# Patient Record
Sex: Male | Born: 1940 | Race: White | Hispanic: No | Smoking: Former smoker
Health system: Southern US, Community
[De-identification: ages and names within clinical notes are randomized; demographics above are authoritative.]

## PROBLEM LIST (undated history)

## (undated) DIAGNOSIS — R51 Headache: Secondary | ICD-10-CM

## (undated) DIAGNOSIS — J309 Allergic rhinitis, unspecified: Secondary | ICD-10-CM

## (undated) DIAGNOSIS — E785 Hyperlipidemia, unspecified: Secondary | ICD-10-CM

## (undated) DIAGNOSIS — J45909 Unspecified asthma, uncomplicated: Secondary | ICD-10-CM

## (undated) DIAGNOSIS — K5792 Diverticulitis of intestine, part unspecified, without perforation or abscess without bleeding: Secondary | ICD-10-CM

## (undated) DIAGNOSIS — N529 Male erectile dysfunction, unspecified: Secondary | ICD-10-CM

## (undated) DIAGNOSIS — R519 Headache, unspecified: Secondary | ICD-10-CM

## (undated) DIAGNOSIS — D126 Benign neoplasm of colon, unspecified: Secondary | ICD-10-CM

## (undated) HISTORY — PX: LUNG SEGMENTECTOMY: SHX452

## (undated) HISTORY — DX: Hyperlipidemia, unspecified: E78.5

## (undated) HISTORY — PX: COLONOSCOPY W/ POLYPECTOMY: SHX1380

## (undated) HISTORY — DX: Allergic rhinitis, unspecified: J30.9

## (undated) HISTORY — DX: Male erectile dysfunction, unspecified: N52.9

## (undated) HISTORY — DX: Benign neoplasm of colon, unspecified: D12.6

## (undated) HISTORY — DX: Diverticulitis of intestine, part unspecified, without perforation or abscess without bleeding: K57.92

## (undated) HISTORY — DX: Unspecified asthma, uncomplicated: J45.909

---

## 2001-01-19 ENCOUNTER — Emergency Department (HOSPITAL_COMMUNITY): Admission: EM | Admit: 2001-01-19 | Discharge: 2001-01-20 | Payer: Self-pay | Admitting: *Deleted

## 2004-10-21 ENCOUNTER — Ambulatory Visit (HOSPITAL_COMMUNITY): Admission: RE | Admit: 2004-10-21 | Discharge: 2004-10-21 | Payer: Self-pay | Admitting: Gastroenterology

## 2004-10-21 ENCOUNTER — Encounter (INDEPENDENT_AMBULATORY_CARE_PROVIDER_SITE_OTHER): Payer: Self-pay | Admitting: *Deleted

## 2009-10-31 ENCOUNTER — Ambulatory Visit (HOSPITAL_COMMUNITY): Admission: RE | Admit: 2009-10-31 | Discharge: 2009-10-31 | Payer: Self-pay | Admitting: Gastroenterology

## 2011-01-30 NOTE — Op Note (Signed)
NAME:  Jerry Davidson, Jerry Davidson NO.:  [id number]   MEDICAL RECORD NO.:  [id number]          PATIENT TYPE:  AMB   LOCATION:  ENDO                         FACILITY:  [facility]   PHYSICIAN:  Danise Edge, M.D.   DATE OF BIRTH:  August 28, 1941   DATE OF PROCEDURE:  10/21/2004  DATE OF DISCHARGE:                                 OPERATIVE REPORT   PROCEDURE:  Colonoscopy with polypectomy.   INDICATIONS FOR PROCEDURE:  [name] is a 70 year old male born  1941-02-21.  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  He is  scheduled to undergo his first screening colonoscopy with polypectomy to  prevent colon cancer.   ENDOSCOPIST:  Danise Edge, M.D.   PREMEDICATION:  Versed 6 mg, Demerol 70 mg.   DESCRIPTION OF PROCEDURE:  After obtaining informed consent, [name] was  placed in the left lateral decubitus position.  I administered intravenous  Demerol and intravenous Versed to achieve conscious sedation for the  procedure.  The patient's blood pressure, oxygen saturation, and cardiac  rhythm were monitored throughout the procedure and documented in the medical  record.   Anal inspection and digital rectal exam were normal.  The prostate was  nonnodular.  The Olympus adjustable pediatric colonoscope was introduced  into the rectum and advanced to the cecum.  The colonic preparation for the  exam today was excellent.   Rectum:  From the distal rectum, a 1-mm sessile polyp was removed with the  cold biopsy forceps.   Sigmoid colon and descending colon:  Colonic diverticulosis.   Splenic flexure:  Normal.   Transverse colon:  Normal.   Hepatic flexure:  Normal.   Ascending colon:  Normal.   Cecum and ileocecal valve:  Normal.   ASSESSMENT:  1.  From the distal rectum, a small polyp was removed.  2.  Left colonic diverticulosis.  3.  Otherwise normal screening proctocolonoscopy to the cecum.      MJ/MEDQ  D:  10/21/2004  T:  10/21/2004  Job:   [id number]   cc:   Theressa Millard, M.D.  [address] [place]  [place] [address]  Fax: [phone]

## 2012-09-14 DIAGNOSIS — K5792 Diverticulitis of intestine, part unspecified, without perforation or abscess without bleeding: Secondary | ICD-10-CM

## 2012-09-14 HISTORY — DX: Diverticulitis of intestine, part unspecified, without perforation or abscess without bleeding: K57.92

## 2013-02-17 ENCOUNTER — Other Ambulatory Visit: Payer: Self-pay | Admitting: Internal Medicine

## 2013-02-17 ENCOUNTER — Ambulatory Visit
Admission: RE | Admit: 2013-02-17 | Discharge: 2013-02-17 | Disposition: A | Discharge status: Home / Self Care | Payer: 59 | Source: Ambulatory Visit | Attending: Internal Medicine | Admitting: Internal Medicine

## 2013-02-17 DIAGNOSIS — R109 Unspecified abdominal pain: Secondary | ICD-10-CM

## 2013-02-17 MED ORDER — IOHEXOL 300 MG/ML  SOLN
100.0000 mL | Freq: Once | INTRAMUSCULAR | Status: AC | PRN
  Administered 2013-02-17: 100 mL via INTRAVENOUS

## 2014-03-05 ENCOUNTER — Other Ambulatory Visit: Payer: Self-pay | Admitting: Internal Medicine

## 2014-03-05 DIAGNOSIS — R1010 Upper abdominal pain, unspecified: Secondary | ICD-10-CM

## 2014-03-09 ENCOUNTER — Ambulatory Visit
Admission: RE | Admit: 2014-03-09 | Discharge: 2014-03-09 | Disposition: A | Discharge status: Home / Self Care | Payer: Medicare Other | Source: Ambulatory Visit | Attending: Internal Medicine | Admitting: Internal Medicine

## 2014-03-09 DIAGNOSIS — R1010 Upper abdominal pain, unspecified: Secondary | ICD-10-CM

## 2014-11-28 ENCOUNTER — Other Ambulatory Visit: Payer: Self-pay | Admitting: Gastroenterology

## 2015-01-17 ENCOUNTER — Encounter (HOSPITAL_COMMUNITY): Payer: Self-pay | Admitting: *Deleted

## 2015-01-25 ENCOUNTER — Other Ambulatory Visit: Payer: Self-pay | Admitting: Gastroenterology

## 2015-01-28 ENCOUNTER — Ambulatory Visit (HOSPITAL_COMMUNITY): Payer: Medicare Other | Admitting: Certified Registered"

## 2015-01-28 ENCOUNTER — Encounter (HOSPITAL_COMMUNITY): Payer: Self-pay

## 2015-01-28 ENCOUNTER — Encounter (HOSPITAL_COMMUNITY)
Admission: RE | Disposition: A | Discharge status: Home / Self Care | Payer: Self-pay | Source: Ambulatory Visit | Attending: Gastroenterology

## 2015-01-28 ENCOUNTER — Ambulatory Visit (HOSPITAL_COMMUNITY)
Admission: RE | Admit: 2015-01-28 | Discharge: 2015-01-28 | Disposition: A | Discharge status: Home / Self Care | Payer: Medicare Other | Source: Ambulatory Visit | Attending: Gastroenterology | Admitting: Gastroenterology

## 2015-01-28 DIAGNOSIS — Z87891 Personal history of nicotine dependence: Secondary | ICD-10-CM | POA: Diagnosis not present

## 2015-01-28 DIAGNOSIS — D122 Benign neoplasm of ascending colon: Secondary | ICD-10-CM | POA: Diagnosis not present

## 2015-01-28 DIAGNOSIS — Z86010 Personal history of colonic polyps: Secondary | ICD-10-CM | POA: Insufficient documentation

## 2015-01-28 DIAGNOSIS — K5730 Diverticulosis of large intestine without perforation or abscess without bleeding: Secondary | ICD-10-CM | POA: Insufficient documentation

## 2015-01-28 DIAGNOSIS — J4530 Mild persistent asthma, uncomplicated: Secondary | ICD-10-CM | POA: Insufficient documentation

## 2015-01-28 DIAGNOSIS — Z09 Encounter for follow-up examination after completed treatment for conditions other than malignant neoplasm: Secondary | ICD-10-CM | POA: Diagnosis present

## 2015-01-28 DIAGNOSIS — K589 Irritable bowel syndrome without diarrhea: Secondary | ICD-10-CM | POA: Insufficient documentation

## 2015-01-28 HISTORY — DX: Headache: R51

## 2015-01-28 HISTORY — DX: Headache, unspecified: R51.9

## 2015-01-28 HISTORY — PX: COLONOSCOPY WITH PROPOFOL: SHX5780

## 2015-01-28 SURGERY — COLONOSCOPY WITH PROPOFOL
Anesthesia: Monitor Anesthesia Care

## 2015-01-28 MED ORDER — SODIUM CHLORIDE 0.9 % IV SOLN: INTRAVENOUS | Status: DC

## 2015-01-28 MED ORDER — PROPOFOL 10 MG/ML IV BOLUS
INTRAVENOUS | Status: DC | PRN
  Administered 2015-01-28 (×5): 50 mg via INTRAVENOUS
  Administered 2015-01-28: 100 mg via INTRAVENOUS

## 2015-01-28 MED ORDER — PROPOFOL 10 MG/ML IV BOLUS
INTRAVENOUS | Status: AC
  Filled 2015-01-28: qty 20

## 2015-01-28 MED ORDER — LACTATED RINGERS IV SOLN
INTRAVENOUS | Status: DC
  Administered 2015-01-28: 1000 mL via INTRAVENOUS

## 2015-01-28 SURGICAL SUPPLY — 22 items

## 2015-01-28 NOTE — Discharge Instructions (Signed)

## 2015-01-28 NOTE — Transfer of Care (Signed)
Immediate Anesthesia Transfer of Care Note  Patient: Jerry Davidson  Procedure(s) Performed: Procedure(s): COLONOSCOPY WITH PROPOFOL (N/A)  Patient Location: PACU and Endoscopy Unit  Anesthesia Type:MAC  Level of Consciousness: awake, sedated and responds to stimulation  Airway & Oxygen Therapy: Patient Spontanous Breathing and Patient connected to nasal cannula oxygen  Post-op Assessment: Report given to RN and Post -op Vital signs reviewed and stable  Post vital signs: Reviewed and stable  Last Vitals:  Filed Vitals:   01/28/15 0946  BP: 90/44  Pulse: 56  Temp: 37 C  Resp: 18    Complications: No apparent anesthesia complications

## 2015-01-28 NOTE — H&P (Signed)
  Procedure: Surveillance colonoscopy. 10/31/2009 normal surveillance colonoscopy performed. 2006 colonoscopy with removal of a diminutive adenomatous polyp  History: The patient is a 74 year old male born 07-28-1941. He is scheduled to undergo a surveillance colonoscopy today.  Past medical history: Hypercholesterolemia. Mild persistent asthma. Irritable bowel syndrome. Allergic rhinitis. 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 Abdomen is soft and nontender to palpation. Lungs are clear to auscultation. Cardiac exam reveals a regular rhythm.  Plan: Proceed with surveillance colonoscopy

## 2015-01-28 NOTE — Anesthesia Preprocedure Evaluation (Signed)
Anesthesia Evaluation  Patient identified by MRN, date of birth, ID band Patient awake    Reviewed: Allergy & Precautions, NPO status , Patient's Chart, lab work & pertinent test results  Airway Mallampati: I   Neck ROM: Full    Dental   Pulmonary former smoker,    Pulmonary exam normal       Cardiovascular Normal cardiovascular exam    Neuro/Psych    GI/Hepatic   Endo/Other    Renal/GU      Musculoskeletal   Abdominal   Peds  Hematology   Anesthesia Other Findings   Reproductive/Obstetrics                             Anesthesia Physical Anesthesia Plan  ASA: II  Anesthesia Plan: MAC   Post-op Pain Management:    Induction: Intravenous  Airway Management Planned: Natural Airway  Additional Equipment:   Intra-op Plan:   Post-operative Plan:   Informed Consent: I have reviewed the patients History and Physical, chart, labs and discussed the procedure including the risks, benefits and alternatives for the proposed anesthesia with the patient or authorized representative who has indicated his/her understanding and acceptance.     Plan Discussed with: CRNA and Surgeon  Anesthesia Plan Comments:         Anesthesia Quick Evaluation

## 2015-01-28 NOTE — Anesthesia Postprocedure Evaluation (Signed)
Anesthesia Post Note  Patient: Jerry Davidson  Procedure(s) Performed: Procedure(s) (LRB): COLONOSCOPY WITH PROPOFOL (N/A)  Anesthesia type: MAC  Patient location: PACU  Post pain: Pain level controlled  Post assessment: Patient's Cardiovascular Status Stable  Last Vitals:  Filed Vitals:   01/28/15 1020  BP: 113/55  Pulse: 52  Temp:   Resp: 19    Post vital signs: Reviewed and stable  Level of consciousness: awake  Complications: No apparent anesthesia complications

## 2015-01-28 NOTE — Op Note (Signed)
Procedure: Surveillance colonoscopy. 2006 colonoscopy performed with removal of a diminutive adenomatous rectal polyp  Endoscopist: Earle Gell  Premedication: Propofol administered by anesthesia  Procedure: The patient was placed in the left lateral decubitus position. Anal inspection and digital rectal exam were normal. The Pentax pediatric colonoscope was introduced into the rectum and advanced to the cecum. A normal-appearing appendiceal orifice was identified. A normal-appearing ileocecal valve was intubated and the terminal ileum inspected. Colonic preparation for the exam today was good. Withdrawal time was 20 minutes  Rectum. Normal. Retroflexed view of the distal rectum was normal  Sigmoid colon and descending colon. Left colonic diverticulosis  Splenic flexure. Normal  Transverse colon. Normal  Hepatic flexure. Normal  Ascending colon. Two 5 mm sessile polyps were removed with the cold snare and a 7 mm sessile polyp was removed in piecemeal fashion with the hot snare  Cecum and ileocecal valve. Normal  Terminal ileum. Normal  Assessment:  #1. Left colonic diverticulosis  #2. Three small sessile polyps were removed from the ascending colon  Plan: I will reevaluate the polyp pathology to determine when the patient should undergo a repeat colonoscopy

## 2015-01-29 ENCOUNTER — Encounter (HOSPITAL_COMMUNITY): Payer: Self-pay | Admitting: Gastroenterology

## 2015-10-17 DIAGNOSIS — H2511 Age-related nuclear cataract, right eye: Secondary | ICD-10-CM | POA: Diagnosis not present

## 2015-10-17 DIAGNOSIS — H35363 Drusen (degenerative) of macula, bilateral: Secondary | ICD-10-CM | POA: Diagnosis not present

## 2015-10-17 DIAGNOSIS — H25012 Cortical age-related cataract, left eye: Secondary | ICD-10-CM | POA: Diagnosis not present

## 2015-10-17 DIAGNOSIS — H2512 Age-related nuclear cataract, left eye: Secondary | ICD-10-CM | POA: Diagnosis not present

## 2015-10-17 DIAGNOSIS — H25011 Cortical age-related cataract, right eye: Secondary | ICD-10-CM | POA: Diagnosis not present

## 2015-10-29 DIAGNOSIS — Z125 Encounter for screening for malignant neoplasm of prostate: Secondary | ICD-10-CM | POA: Diagnosis not present

## 2015-10-29 DIAGNOSIS — Z79899 Other long term (current) drug therapy: Secondary | ICD-10-CM | POA: Diagnosis not present

## 2015-10-29 DIAGNOSIS — Z0000 Encounter for general adult medical examination without abnormal findings: Secondary | ICD-10-CM | POA: Diagnosis not present

## 2015-10-29 DIAGNOSIS — E785 Hyperlipidemia, unspecified: Secondary | ICD-10-CM | POA: Diagnosis not present

## 2015-10-31 DIAGNOSIS — M7542 Impingement syndrome of left shoulder: Secondary | ICD-10-CM | POA: Diagnosis not present

## 2015-11-05 DIAGNOSIS — M25612 Stiffness of left shoulder, not elsewhere classified: Secondary | ICD-10-CM | POA: Diagnosis not present

## 2015-11-05 DIAGNOSIS — M6281 Muscle weakness (generalized): Secondary | ICD-10-CM | POA: Diagnosis not present

## 2015-11-05 DIAGNOSIS — M25512 Pain in left shoulder: Secondary | ICD-10-CM | POA: Diagnosis not present

## 2015-11-05 DIAGNOSIS — M7542 Impingement syndrome of left shoulder: Secondary | ICD-10-CM | POA: Diagnosis not present

## 2015-11-14 DIAGNOSIS — M6281 Muscle weakness (generalized): Secondary | ICD-10-CM | POA: Diagnosis not present

## 2015-11-14 DIAGNOSIS — M25612 Stiffness of left shoulder, not elsewhere classified: Secondary | ICD-10-CM | POA: Diagnosis not present

## 2015-11-14 DIAGNOSIS — M25512 Pain in left shoulder: Secondary | ICD-10-CM | POA: Diagnosis not present

## 2015-11-14 DIAGNOSIS — M7542 Impingement syndrome of left shoulder: Secondary | ICD-10-CM | POA: Diagnosis not present

## 2015-11-20 ENCOUNTER — Ambulatory Visit
Admission: RE | Admit: 2015-11-20 | Discharge: 2015-11-20 | Disposition: A | Discharge status: Home / Self Care | Payer: Medicare Other | Source: Ambulatory Visit | Attending: Internal Medicine | Admitting: Internal Medicine

## 2015-11-20 ENCOUNTER — Other Ambulatory Visit: Payer: Self-pay | Admitting: Internal Medicine

## 2015-11-20 DIAGNOSIS — N5089 Other specified disorders of the male genital organs: Secondary | ICD-10-CM | POA: Diagnosis not present

## 2015-11-20 DIAGNOSIS — M6281 Muscle weakness (generalized): Secondary | ICD-10-CM | POA: Diagnosis not present

## 2015-11-20 DIAGNOSIS — M25512 Pain in left shoulder: Secondary | ICD-10-CM | POA: Diagnosis not present

## 2015-11-20 DIAGNOSIS — M25612 Stiffness of left shoulder, not elsewhere classified: Secondary | ICD-10-CM | POA: Diagnosis not present

## 2015-11-20 DIAGNOSIS — M7542 Impingement syndrome of left shoulder: Secondary | ICD-10-CM | POA: Diagnosis not present

## 2015-11-29 DIAGNOSIS — Z0000 Encounter for general adult medical examination without abnormal findings: Secondary | ICD-10-CM | POA: Diagnosis not present

## 2015-11-29 DIAGNOSIS — N4340 Spermatocele of epididymis, unspecified: Secondary | ICD-10-CM | POA: Diagnosis not present

## 2015-12-02 DIAGNOSIS — M7542 Impingement syndrome of left shoulder: Secondary | ICD-10-CM | POA: Diagnosis not present

## 2015-12-04 DIAGNOSIS — M25512 Pain in left shoulder: Secondary | ICD-10-CM | POA: Diagnosis not present

## 2015-12-04 DIAGNOSIS — M25612 Stiffness of left shoulder, not elsewhere classified: Secondary | ICD-10-CM | POA: Diagnosis not present

## 2015-12-04 DIAGNOSIS — M7542 Impingement syndrome of left shoulder: Secondary | ICD-10-CM | POA: Diagnosis not present

## 2015-12-04 DIAGNOSIS — M6281 Muscle weakness (generalized): Secondary | ICD-10-CM | POA: Diagnosis not present

## 2015-12-12 DIAGNOSIS — M6281 Muscle weakness (generalized): Secondary | ICD-10-CM | POA: Diagnosis not present

## 2015-12-12 DIAGNOSIS — M25612 Stiffness of left shoulder, not elsewhere classified: Secondary | ICD-10-CM | POA: Diagnosis not present

## 2015-12-12 DIAGNOSIS — M25512 Pain in left shoulder: Secondary | ICD-10-CM | POA: Diagnosis not present

## 2015-12-12 DIAGNOSIS — M7542 Impingement syndrome of left shoulder: Secondary | ICD-10-CM | POA: Diagnosis not present

## 2016-10-20 DIAGNOSIS — H2513 Age-related nuclear cataract, bilateral: Secondary | ICD-10-CM | POA: Diagnosis not present

## 2016-10-20 DIAGNOSIS — H43813 Vitreous degeneration, bilateral: Secondary | ICD-10-CM | POA: Diagnosis not present

## 2016-10-20 DIAGNOSIS — H25013 Cortical age-related cataract, bilateral: Secondary | ICD-10-CM | POA: Diagnosis not present

## 2016-10-20 DIAGNOSIS — H35363 Drusen (degenerative) of macula, bilateral: Secondary | ICD-10-CM | POA: Diagnosis not present

## 2016-10-29 DIAGNOSIS — K219 Gastro-esophageal reflux disease without esophagitis: Secondary | ICD-10-CM | POA: Diagnosis not present

## 2016-10-29 DIAGNOSIS — Z1389 Encounter for screening for other disorder: Secondary | ICD-10-CM | POA: Diagnosis not present

## 2016-10-29 DIAGNOSIS — E785 Hyperlipidemia, unspecified: Secondary | ICD-10-CM | POA: Diagnosis not present

## 2016-10-29 DIAGNOSIS — K589 Irritable bowel syndrome without diarrhea: Secondary | ICD-10-CM | POA: Diagnosis not present

## 2016-10-29 DIAGNOSIS — J45909 Unspecified asthma, uncomplicated: Secondary | ICD-10-CM | POA: Diagnosis not present

## 2016-10-29 DIAGNOSIS — Z125 Encounter for screening for malignant neoplasm of prostate: Secondary | ICD-10-CM | POA: Diagnosis not present

## 2016-10-29 DIAGNOSIS — Z0000 Encounter for general adult medical examination without abnormal findings: Secondary | ICD-10-CM | POA: Diagnosis not present

## 2016-10-29 DIAGNOSIS — R109 Unspecified abdominal pain: Secondary | ICD-10-CM | POA: Diagnosis not present

## 2016-11-05 DIAGNOSIS — N183 Chronic kidney disease, stage 3 (moderate): Secondary | ICD-10-CM | POA: Diagnosis not present

## 2016-11-05 DIAGNOSIS — K5792 Diverticulitis of intestine, part unspecified, without perforation or abscess without bleeding: Secondary | ICD-10-CM | POA: Diagnosis not present

## 2016-11-05 DIAGNOSIS — E785 Hyperlipidemia, unspecified: Secondary | ICD-10-CM | POA: Diagnosis not present

## 2017-05-05 DIAGNOSIS — E785 Hyperlipidemia, unspecified: Secondary | ICD-10-CM | POA: Diagnosis not present

## 2017-05-05 DIAGNOSIS — K219 Gastro-esophageal reflux disease without esophagitis: Secondary | ICD-10-CM | POA: Diagnosis not present

## 2017-05-05 DIAGNOSIS — R1032 Left lower quadrant pain: Secondary | ICD-10-CM | POA: Diagnosis not present

## 2017-05-05 DIAGNOSIS — R1031 Right lower quadrant pain: Secondary | ICD-10-CM | POA: Diagnosis not present

## 2017-07-21 IMAGING — US US SCROTUM
1 series · 14 of 25 positions shown · non-contrast
Comparison: CT 02/17/2013.

CLINICAL DATA: Right scrotal swelling.

EXAM:
SCROTAL ULTRASOUND
DOPPLER ULTRASOUND OF THE TESTICLES
TECHNIQUE: Complete ultrasound examination of the testicles, epididymis, and
other scrotal structures was performed. Color and spectral Doppler
ultrasound were also utilized to evaluate blood flow to the
testicles.

[Series 1: us scrotum · 38 acquisitions, 14 frames shown]
[im 1/38]
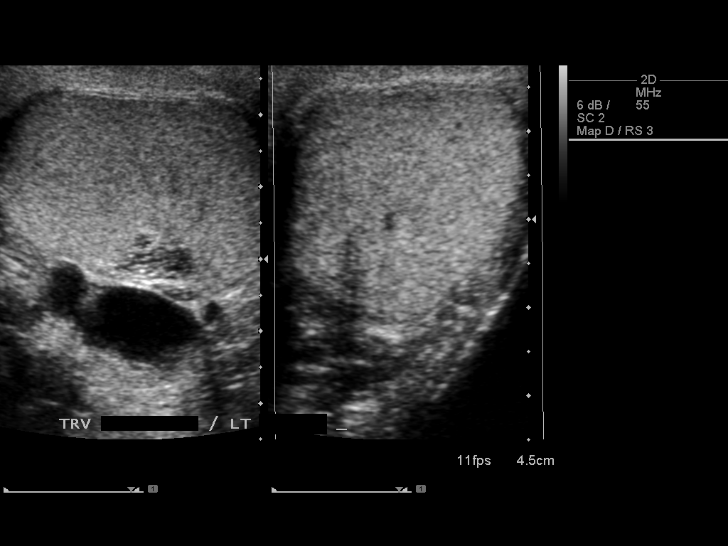
[im 4/38]
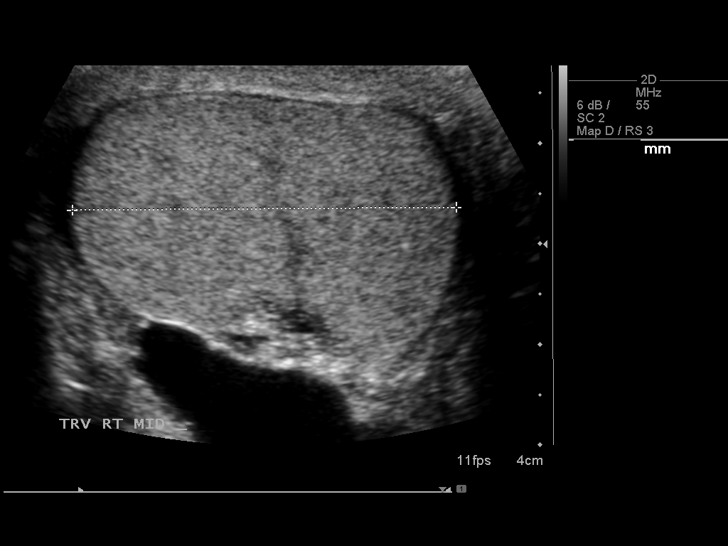
[im 7/38]
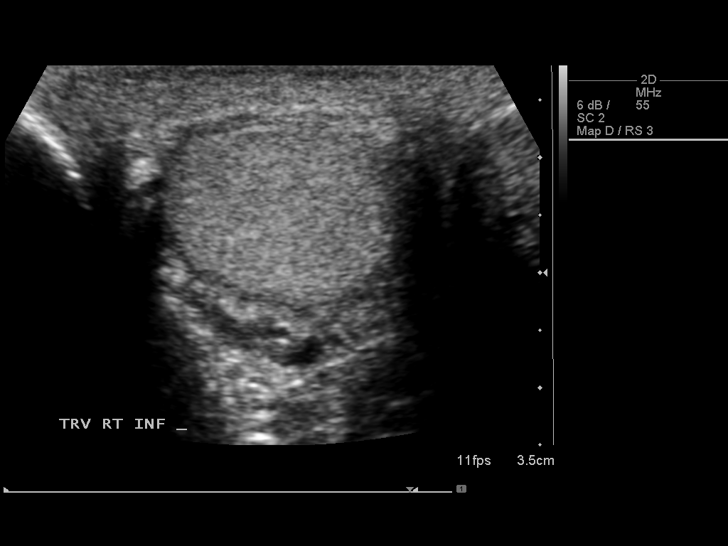
[im 10/38]
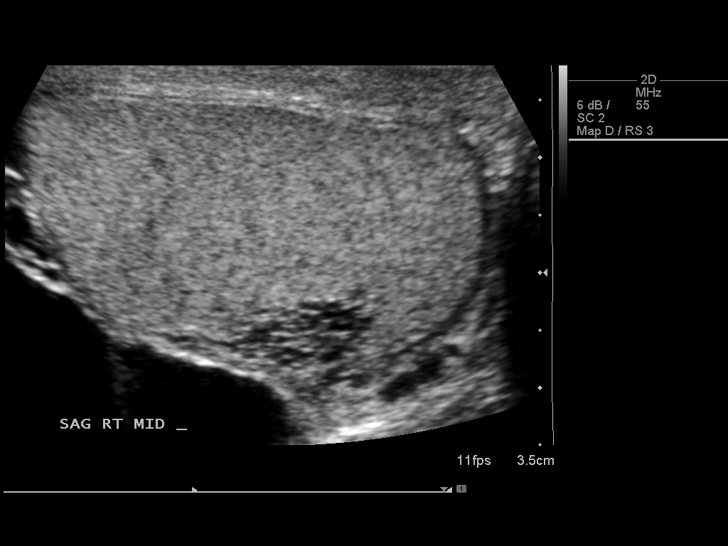
[im 13/38]
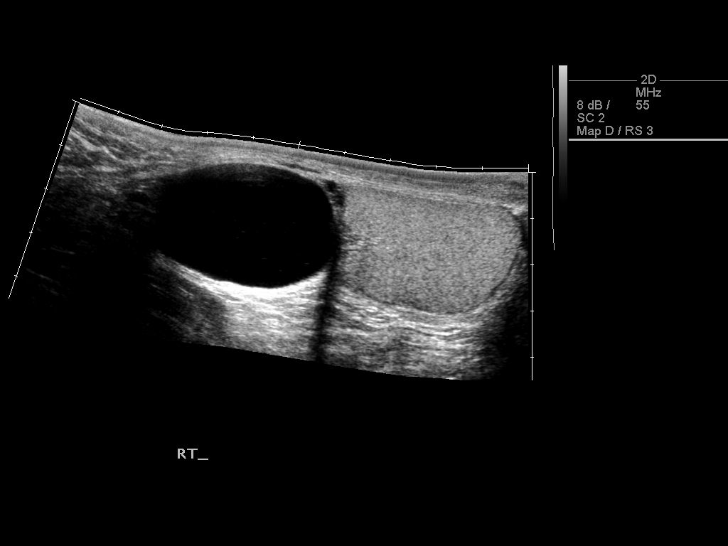
[im 14/38]
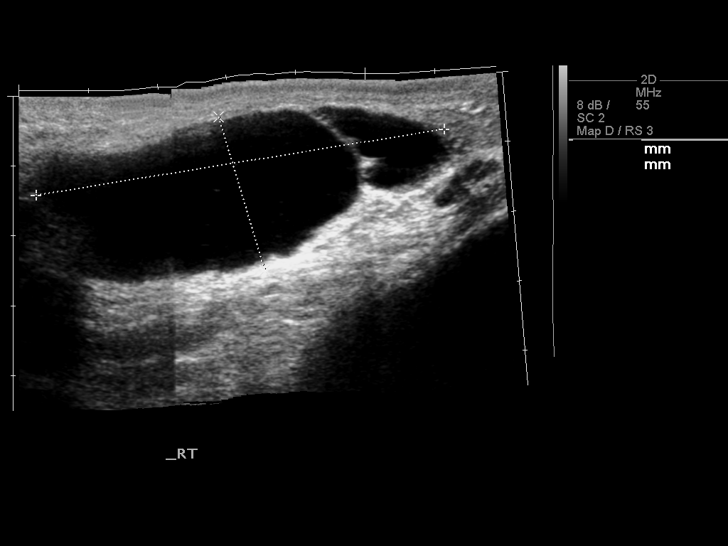
[im 17/38]
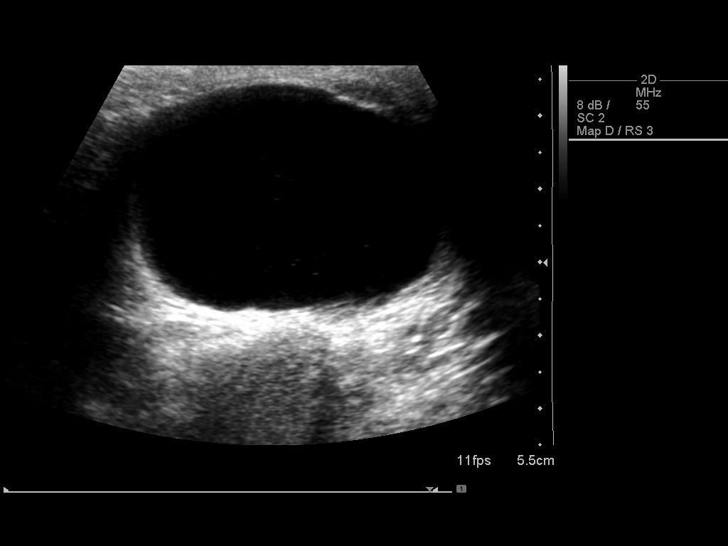
[im 21/38]
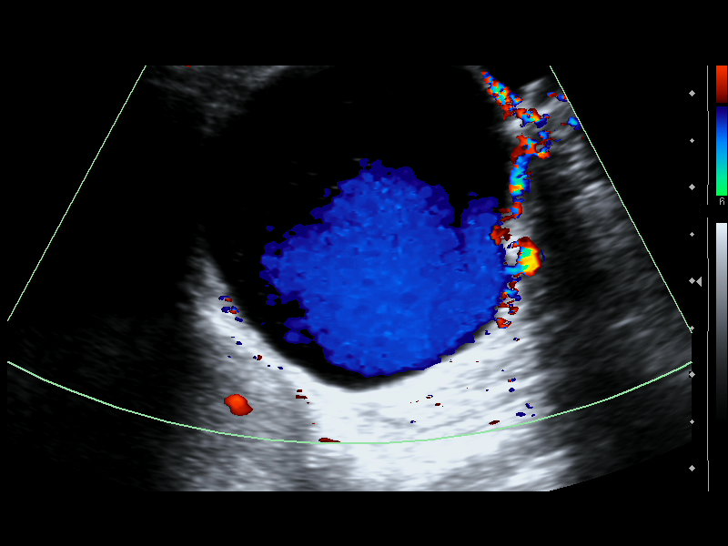
[im 24/38]
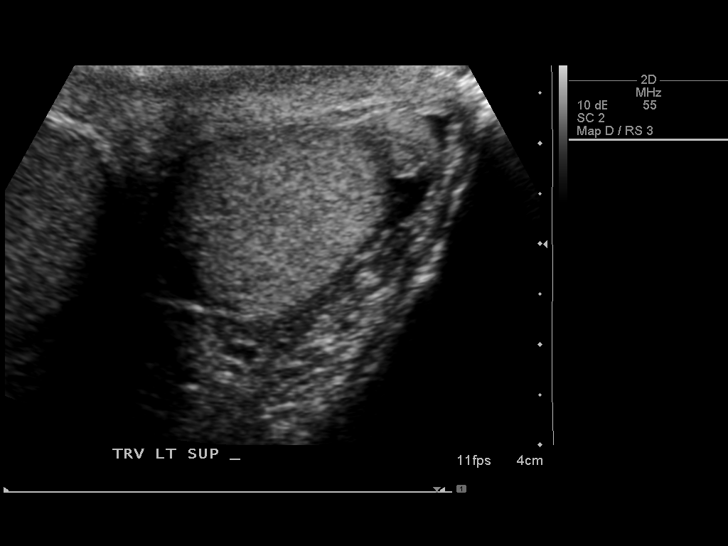
[im 25/38]
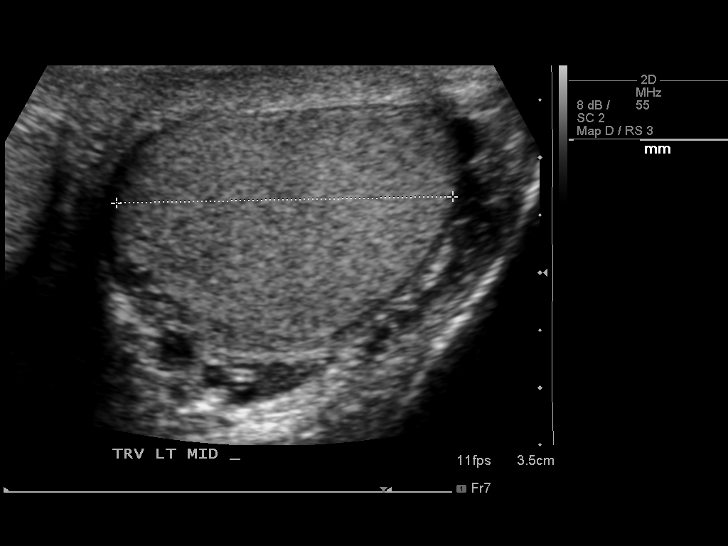
[im 28/38]
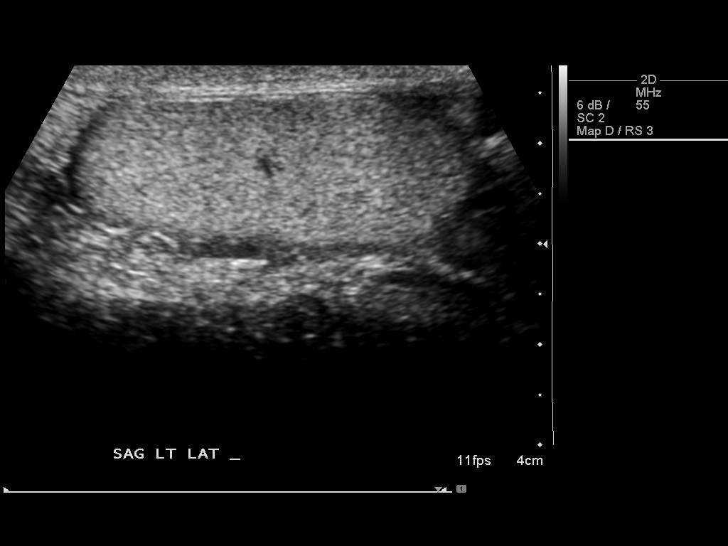
[im 31/38]
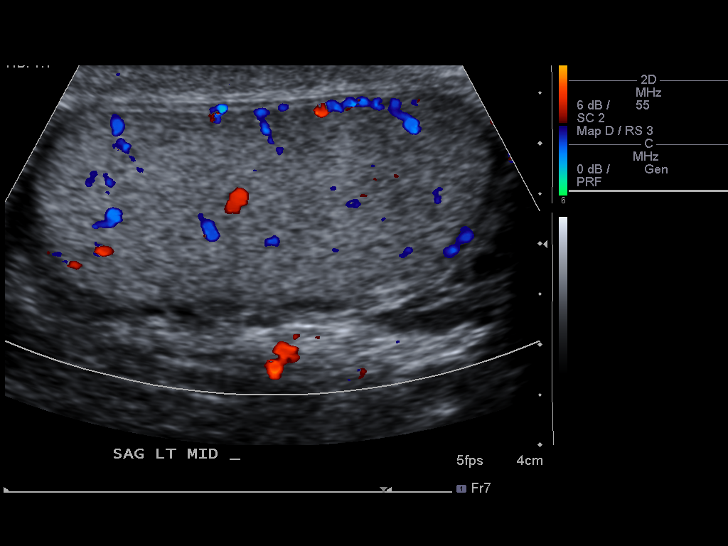
[im 34/38]
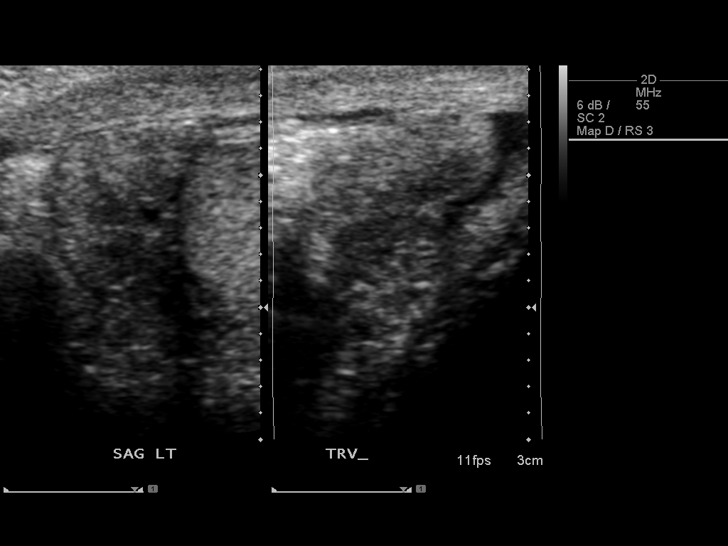
[im 38/38]
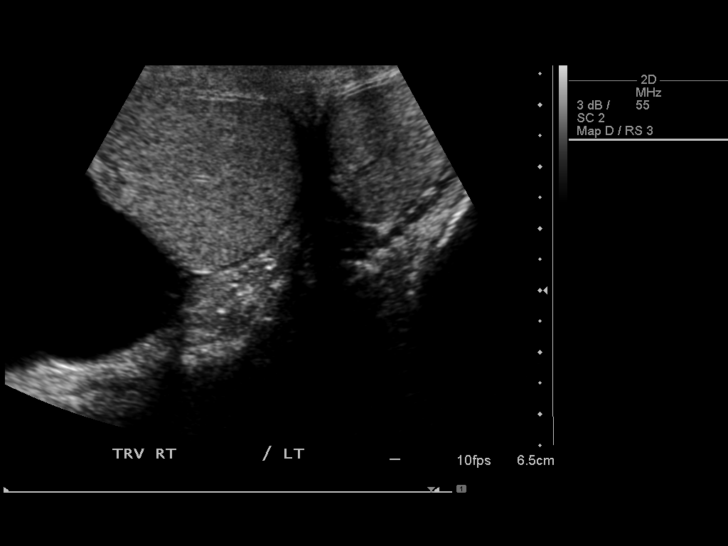

[14 of 25 positions shown; findings below may reference images not displayed]

FINDINGS: Right testicle

Measurements: 3.9 x 2.4 x 3.8 cm. No mass or microlithiasis
visualized.

Left testicle

Measurements: 4.7 x 2.1 x 2.9 cm. No mass or microlithiasis
visualized.

Right epididymis:  5.9 x 2.3 x 4.2 cm probable spermatocele.

Left epididymis:  Normal in size and appearance.

Hydrocele:  None visualized.

Varicocele:  None visualized.

Pulsed Doppler interrogation of both testes demonstrates normal
color Doppler flow bilaterally.
IMPRESSION: 1. Prominent cystic changes on the right consistent with probable
spermatocele.

2. Exam otherwise unremarkable.

## 2017-07-22 DIAGNOSIS — E785 Hyperlipidemia, unspecified: Secondary | ICD-10-CM | POA: Diagnosis not present

## 2017-12-02 ENCOUNTER — Other Ambulatory Visit: Payer: Self-pay | Admitting: Gastroenterology

## 2017-12-02 DIAGNOSIS — R1011 Right upper quadrant pain: Secondary | ICD-10-CM

## 2017-12-14 ENCOUNTER — Ambulatory Visit
Admission: RE | Admit: 2017-12-14 | Discharge: 2017-12-14 | Disposition: A | Discharge status: Home / Self Care | Payer: Medicare Other | Source: Ambulatory Visit | Attending: Gastroenterology | Admitting: Gastroenterology

## 2017-12-14 DIAGNOSIS — R1011 Right upper quadrant pain: Secondary | ICD-10-CM

## 2019-02-10 IMAGING — US US ABDOMEN LIMITED
1 series · 14 of 25 positions shown · non-contrast
Comparison: CT abdomen pelvis of 02/17/2013

CLINICAL DATA: Right upper quadrant abdominal pain

EXAM:
ULTRASOUND ABDOMEN LIMITED RIGHT UPPER QUADRANT

[Series 1: us abdomen limited · 0.14mm/px · 14 of 50 slices shown]
[im 1/50]
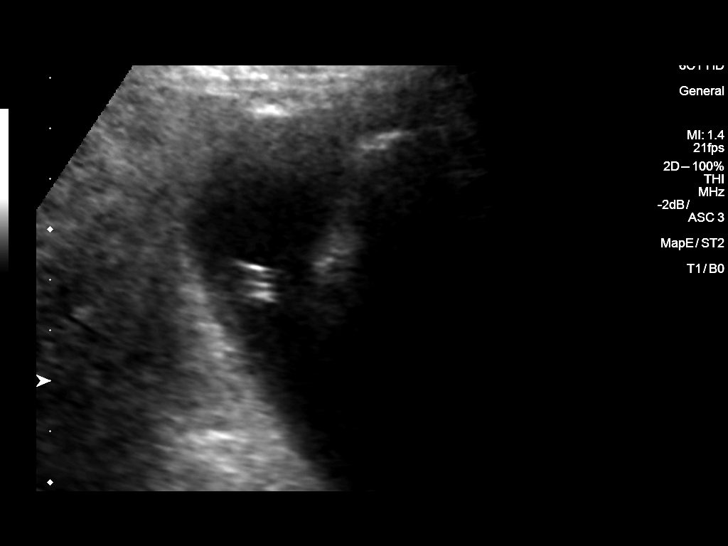
[im 5/50]
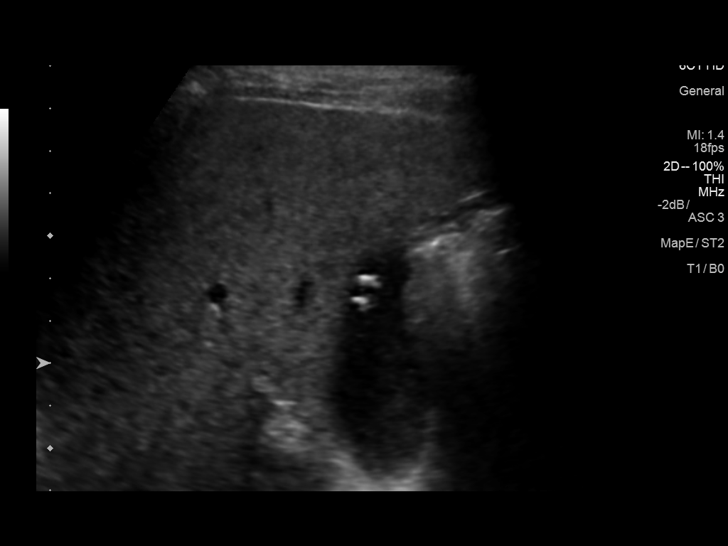
[im 9/50]
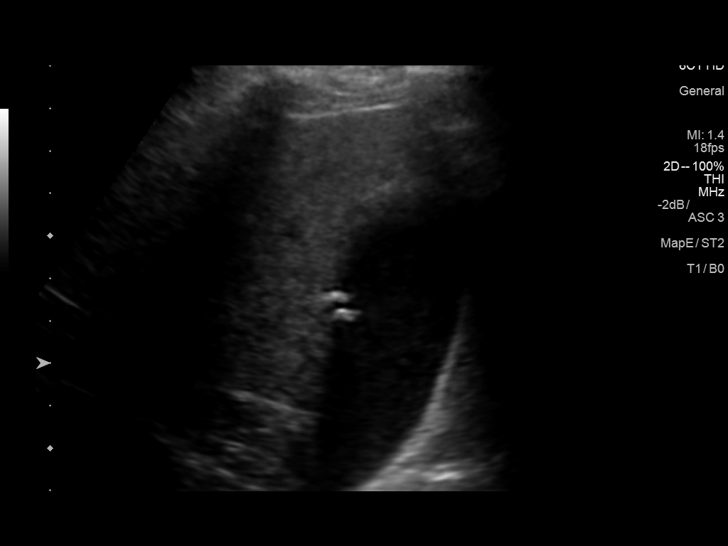
[im 13/50]
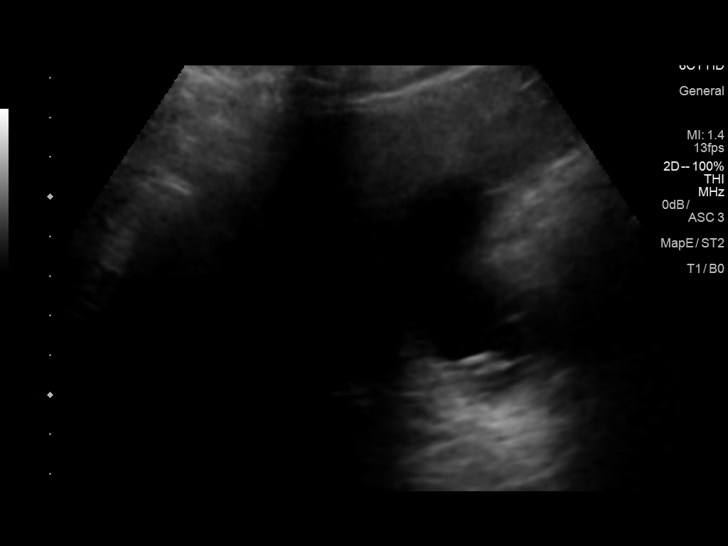
[im 17/50]
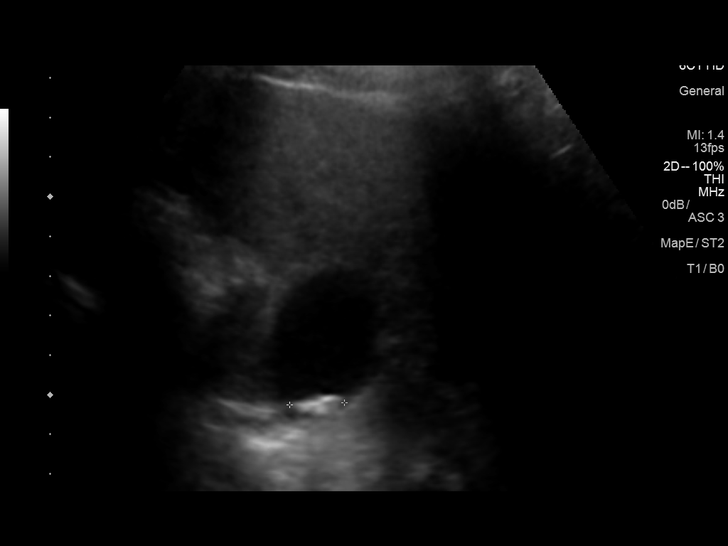
[im 19/50]
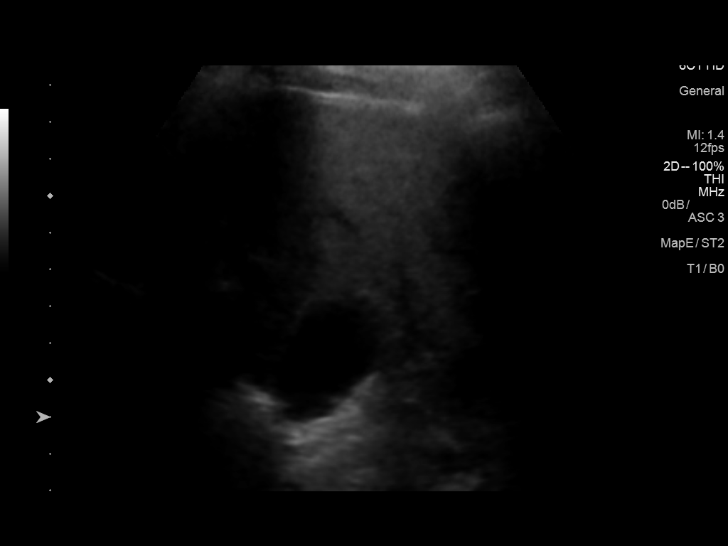
[im 23/50]
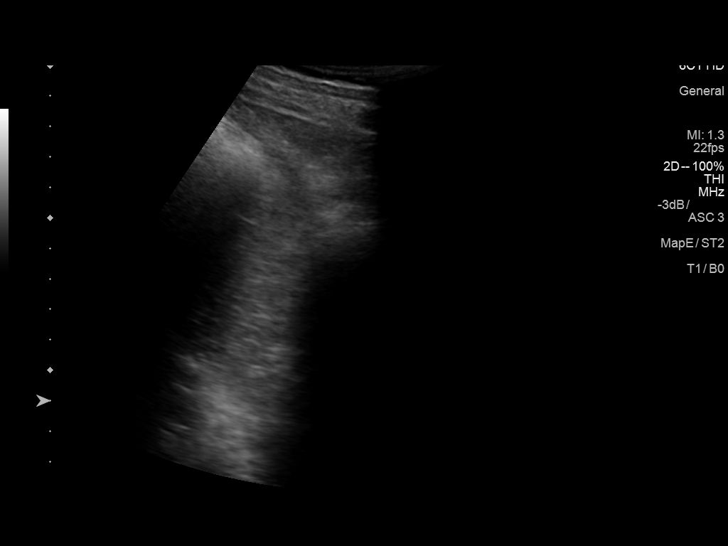
[im 27/50]
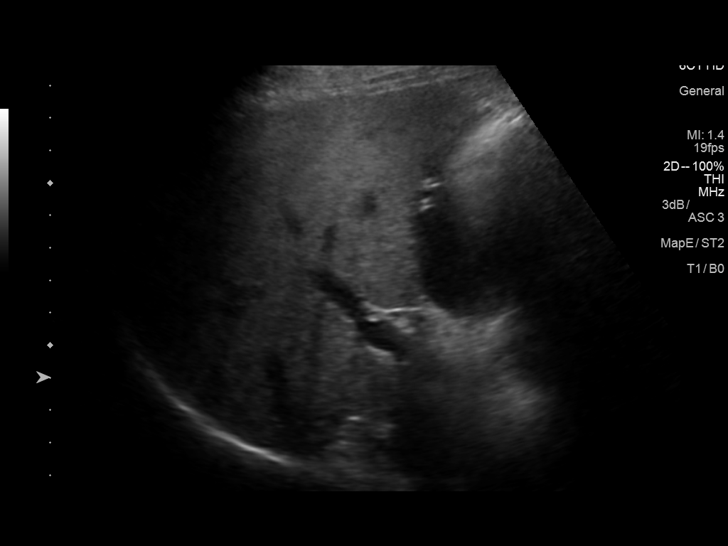
[im 31/50]
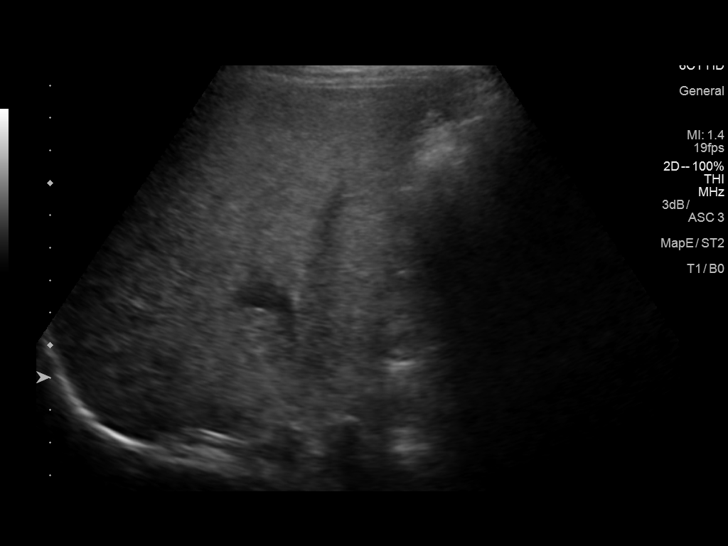
[im 33/50]
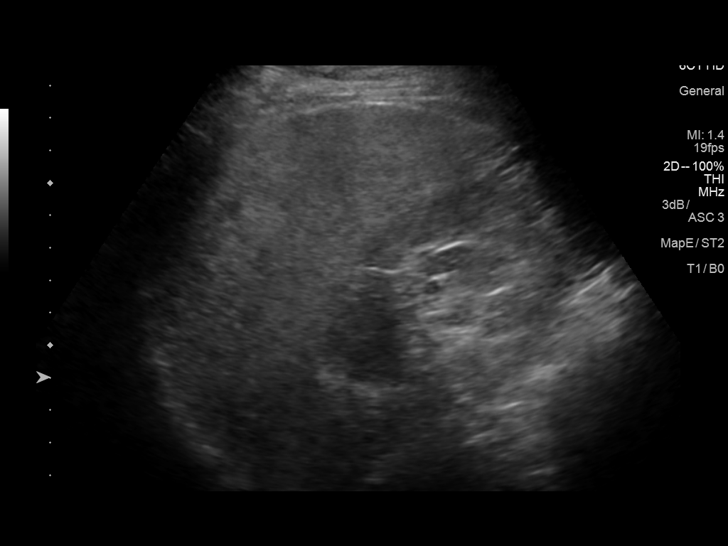
[im 37/50]
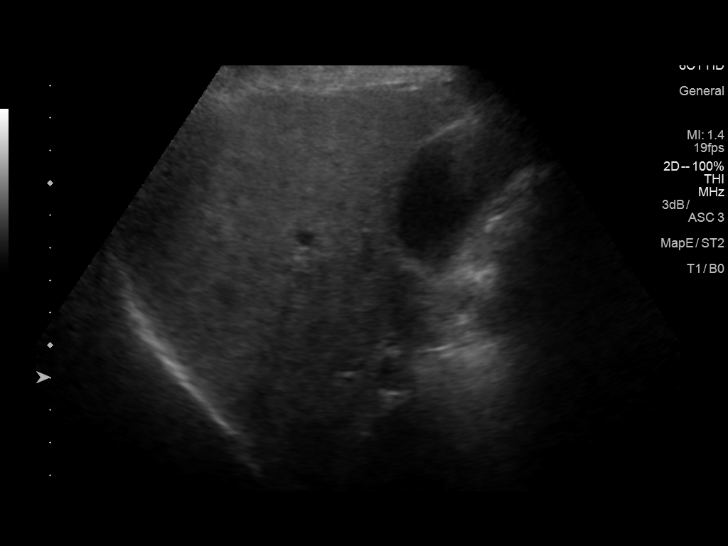
[im 41/50]
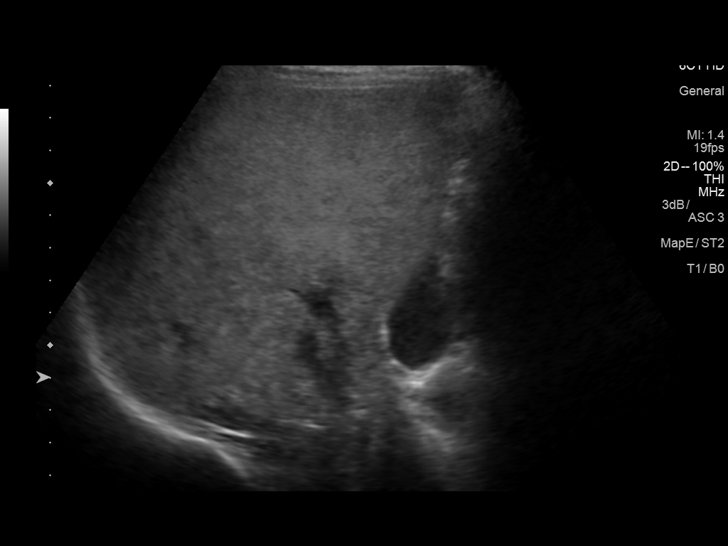
[im 45/50]
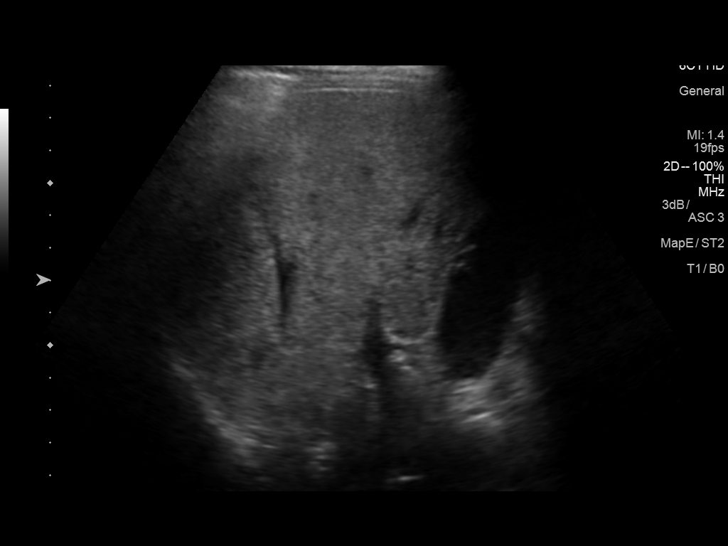
[im 50/50]
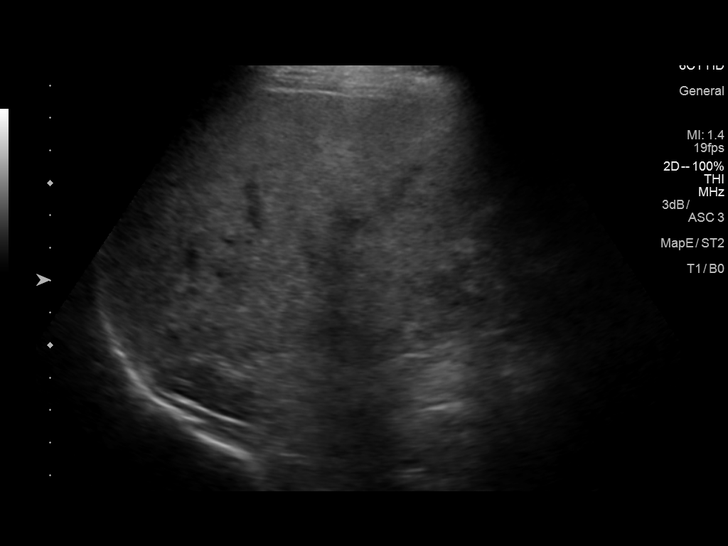

[14 of 25 positions shown; findings below may reference images not displayed]

FINDINGS: Gallbladder:

There are small gallstones present, the largest of 1.4 cm. There is
no pain over the gallbladder with compression and the gallbladder
wall is thickened.

Common bile duct:

Diameter: The common bile duct is normal measuring 3.1 mm in
diameter.

Liver:

The liver has a normal echogenic pattern. There is a small cyst in
the right lobe of 8 mm in diameter. The left lobe is somewhat
obscured by body habitus and bowel gas. Portal blood flow is patent
and in the normal direction.
IMPRESSION: 1. Gallstones.  No pain over the gallbladder with compression.
2. 8 mm hepatic cyst. No other abnormality. The left lobe of liver
is not well seen.

## 2020-10-25 DIAGNOSIS — H2513 Age-related nuclear cataract, bilateral: Secondary | ICD-10-CM | POA: Diagnosis not present

## 2020-10-25 DIAGNOSIS — H25013 Cortical age-related cataract, bilateral: Secondary | ICD-10-CM | POA: Diagnosis not present

## 2020-10-25 DIAGNOSIS — H353131 Nonexudative age-related macular degeneration, bilateral, early dry stage: Secondary | ICD-10-CM | POA: Diagnosis not present

## 2020-10-25 DIAGNOSIS — H04123 Dry eye syndrome of bilateral lacrimal glands: Secondary | ICD-10-CM | POA: Diagnosis not present

## 2020-12-06 ENCOUNTER — Other Ambulatory Visit: Payer: Self-pay | Admitting: Gastroenterology

## 2020-12-06 DIAGNOSIS — R1011 Right upper quadrant pain: Secondary | ICD-10-CM | POA: Diagnosis not present

## 2020-12-06 DIAGNOSIS — Z86010 Personal history of colonic polyps: Secondary | ICD-10-CM | POA: Diagnosis not present

## 2020-12-06 DIAGNOSIS — R1013 Epigastric pain: Secondary | ICD-10-CM | POA: Diagnosis not present

## 2020-12-06 DIAGNOSIS — Z8719 Personal history of other diseases of the digestive system: Secondary | ICD-10-CM | POA: Diagnosis not present

## 2020-12-24 DIAGNOSIS — K219 Gastro-esophageal reflux disease without esophagitis: Secondary | ICD-10-CM | POA: Diagnosis not present

## 2020-12-24 DIAGNOSIS — R5383 Other fatigue: Secondary | ICD-10-CM | POA: Diagnosis not present

## 2020-12-24 DIAGNOSIS — J45909 Unspecified asthma, uncomplicated: Secondary | ICD-10-CM | POA: Diagnosis not present

## 2020-12-24 DIAGNOSIS — R1013 Epigastric pain: Secondary | ICD-10-CM | POA: Diagnosis not present

## 2020-12-24 DIAGNOSIS — E785 Hyperlipidemia, unspecified: Secondary | ICD-10-CM | POA: Diagnosis not present

## 2020-12-24 DIAGNOSIS — Z0000 Encounter for general adult medical examination without abnormal findings: Secondary | ICD-10-CM | POA: Diagnosis not present

## 2020-12-24 DIAGNOSIS — Z79899 Other long term (current) drug therapy: Secondary | ICD-10-CM | POA: Diagnosis not present

## 2020-12-25 ENCOUNTER — Ambulatory Visit
Admission: RE | Admit: 2020-12-25 | Discharge: 2020-12-25 | Disposition: A | Discharge status: Home / Self Care | Payer: Medicare Other | Source: Ambulatory Visit | Attending: Gastroenterology | Admitting: Gastroenterology

## 2020-12-25 DIAGNOSIS — R1011 Right upper quadrant pain: Secondary | ICD-10-CM

## 2020-12-25 DIAGNOSIS — K8020 Calculus of gallbladder without cholecystitis without obstruction: Secondary | ICD-10-CM | POA: Diagnosis not present

## 2021-02-06 DIAGNOSIS — Z86010 Personal history of colonic polyps: Secondary | ICD-10-CM | POA: Diagnosis not present

## 2021-02-06 DIAGNOSIS — D122 Benign neoplasm of ascending colon: Secondary | ICD-10-CM | POA: Diagnosis not present

## 2021-02-06 DIAGNOSIS — K5730 Diverticulosis of large intestine without perforation or abscess without bleeding: Secondary | ICD-10-CM | POA: Diagnosis not present

## 2021-02-06 DIAGNOSIS — K635 Polyp of colon: Secondary | ICD-10-CM | POA: Diagnosis not present

## 2021-02-06 DIAGNOSIS — K648 Other hemorrhoids: Secondary | ICD-10-CM | POA: Diagnosis not present

## 2021-02-06 DIAGNOSIS — D123 Benign neoplasm of transverse colon: Secondary | ICD-10-CM | POA: Diagnosis not present

## 2021-02-11 DIAGNOSIS — D122 Benign neoplasm of ascending colon: Secondary | ICD-10-CM | POA: Diagnosis not present

## 2021-02-11 DIAGNOSIS — K635 Polyp of colon: Secondary | ICD-10-CM | POA: Diagnosis not present

## 2021-02-11 DIAGNOSIS — D123 Benign neoplasm of transverse colon: Secondary | ICD-10-CM | POA: Diagnosis not present

## 2021-02-14 ENCOUNTER — Ambulatory Visit: Payer: Self-pay | Admitting: Surgery

## 2021-02-14 DIAGNOSIS — K8020 Calculus of gallbladder without cholecystitis without obstruction: Secondary | ICD-10-CM | POA: Diagnosis not present

## 2021-02-14 NOTE — H&P (Signed)
History of Present Illness Jerry Davidson. Jerry Paterson MD; 02/14/2021 10:19 AM) The patient is a 80 year old male who presents for evaluation of gall stones. Referred by Dr. Paulita Fujita for gallstones PCP - Dr. Fara Olden  This is a 80 year old male in relatively good health who presents with several years of known gallstones. Recently his symptoms have worsened slightly. He reports increasing amounts of bloating with increased flatulence and burping. Postprandially he does get some epigastric abdominal pain and nausea. He has had several episodes of diarrhea. He has not vomited. He denies any abnormal color to his urine or bowel movements. Ultrasound revealed multiple gallstones. No sign of acute cholecystitis. No previous abdominal surgery.  CLINICAL DATA: Right upper quadrant pain.  EXAM: ULTRASOUND ABDOMEN LIMITED RIGHT UPPER QUADRANT  COMPARISON: 12/14/2017  FINDINGS: Gallbladder:  Cholelithiasis with the largest gallstone measuring 8 mm. Negative sonographic Murphy sign. No gallbladder wall thickening or pericholecystic fluid.  Common bile duct:  Diameter: 7 mm. Echogenic foci within the common bile duct.  Liver:  No solid mass. 1.1 cm cyst along the dome of the liver. Increased hepatic parenchymal echogenicity. Portal vein is patent on color Doppler imaging with normal direction of blood flow towards the liver.  Other: None.  IMPRESSION: 1. Cholelithiasis without sonographic evidence of acute cholecystitis. Echogenic foci within the common bile duct concerning for choledocholithiasis. 2. Increased hepatic parenchymal echogenicity as can be seen with fatty infiltration.   Electronically Signed By: Kathreen Devoid On: 12/25/2020 16:32   Past Surgical History Jerry Davidson, [place]; 02/14/2021 9:38 AM) Colon Polyp Removal - Colonoscopy Colon Polyp Removal - Open Lung Surgery Bilateral.  Diagnostic Studies History Jerry Davidson, CMA; 02/14/2021 9:38  AM) Colonoscopy within last year  Allergies Jerry Davidson, CMA; 02/14/2021 9:47 AM) No Known Drug Allergies [01/06/2018]: Allergies Reconciled  Medication History Jerry Davidson, CMA; 02/14/2021 9:48 AM) Co Q 10 (60MG  Capsule, Oral) Active. Simvastatin (20MG  Tablet, Oral) Active. Tamsulosin HCl (0.4MG  Capsule, Oral) Active. ProAir RespiClick (454 (90 Base)MCG/ACT Aero Pow Br Act, Inhalation) Active. Tylenol (500MG  Capsule, Oral) Active. Medications Reconciled  Social History Jerry Davidson, [place]; 02/14/2021 9:38 AM) Caffeine use Carbonated beverages, Coffee, Tea. No alcohol use No drug use Tobacco use Former smoker.  Family History Jerry Davidson, [place]; 02/14/2021 9:38 AM) Cancer Brother, Father, Mother, Sister. Colon Polyps Brother, Sister. Ischemic Bowel Disease Brother. Migraine Headache Daughter, Sister. Prostate Cancer Father. Respiratory Condition Brother.  Other Problems Jerry Davidson, CMA; 02/14/2021 9:38 AM) Asthma Back Pain Diverticulosis Enlarged Prostate Hemorrhoids Hypercholesterolemia Migraine Headache Other disease, cancer, significant illness     Review of Systems Jerry Davidson CMA; 02/14/2021 9:38 AM) General Present- Fatigue. Not Present- Appetite Loss, Chills, Fever, Night Sweats, Weight Gain and Weight Loss. Skin Not Present- Change in Wart/Mole, Dryness, Hives, Jaundice, New Lesions, Non-Healing Wounds, Rash and Ulcer. HEENT Present- Ringing in the Ears and Seasonal Allergies. Not Present- Earache, Hearing Loss, Hoarseness, Nose Bleed, Oral Ulcers, Sinus Pain, Sore Throat, Visual Disturbances, Wears glasses/contact lenses and Yellow Eyes. Respiratory Not Present- Bloody sputum, Chronic Cough, Difficulty Breathing, Snoring and Wheezing. Breast Not Present- Breast Mass, Breast Pain, Nipple Discharge and Skin Changes. Cardiovascular Present- Shortness of Breath. Not Present- Chest Pain, Difficulty Breathing Lying Down, Leg  Cramps, Palpitations, Rapid Heart Rate and Swelling of Extremities. Gastrointestinal Present- Abdominal Pain, Excessive gas and Nausea. Not Present- Bloating, Bloody Stool, Change in Bowel Habits, Chronic diarrhea, Constipation, Difficulty Swallowing, Gets full quickly at meals, Hemorrhoids, Indigestion, Rectal Pain and Vomiting. Male Genitourinary Present- Nocturia. Not Present- Blood in Urine, Change  in Urinary Stream, Frequency, Impotence, Painful Urination, Urgency and Urine Leakage. Musculoskeletal Present- Back Pain. Not Present- Joint Pain, Joint Stiffness, Muscle Pain, Muscle Weakness and Swelling of Extremities. Neurological Present- Headaches. Not Present- Decreased Memory, Fainting, Numbness, Seizures, Tingling, Tremor, Trouble walking and Weakness. Psychiatric Not Present- Anxiety, Bipolar, Change in Sleep Pattern, Depression, Fearful and Frequent crying. Endocrine Not Present- Cold Intolerance, Excessive Hunger, Hair Changes, Heat Intolerance, Hot flashes and New Diabetes. Hematology Not Present- Blood Thinners, Easy Bruising, Excessive bleeding, Gland problems, HIV and Persistent Infections.  Vitals Jerry Davidson CMA; 02/14/2021 9:47 AM) 02/14/2021 9:39 AM Weight: 188.2 lb Height: 69in Body Surface Area: 2.01 m Body Mass Index: 27.79 kg/m  Temp.: 80F  Pulse: 62 (Regular)  BP: 144/90(Sitting, Left Arm, Standard)        Physical Exam Rodman Key K. Amaris Garrette MD; 02/14/2021 10:20 AM)  The physical exam findings are as follows: Note:Constitutional: WDWN in NAD, conversant, no obvious deformities; resting comfortably Eyes: Pupils equal, round; sclera anicteric; moist conjunctiva; no lid lag HENT: Oral mucosa moist; good dentition Neck: No masses palpated, trachea midline; no thyromegaly Lungs: CTA bilaterally; normal respiratory effort CV: Regular rate and rhythm; no murmurs; extremities well-perfused with no edema Abd: +bowel sounds, soft, minimal epigastric tenderness.  No palpable masses. No sign of hernia in the inguinal region. Musc: Normal gait; no apparent clubbing or cyanosis in extremities Lymphatic: No palpable cervical or axillary lymphadenopathy Skin: Warm, dry; no sign of jaundice Psychiatric - alert and oriented x 4; calm mood and affect    Assessment & Plan Rodman Key K. 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 The surgical procedure has been discussed with the patient. Potential risks, benefits, alternative treatments, and expected outcomes have been explained. All of the patient's questions at this time have been answered. The likelihood of reaching the patient's treatment goal is good. The patient understand the proposed surgical procedure and wishes to proceed.  Jerry Davidson. Georgette Dover, MD, [facility] Surgery  General/ Trauma Surgery   02/14/2021 10:20 AM

## 2021-04-30 ENCOUNTER — Encounter: Payer: Self-pay | Admitting: Surgery

## 2021-04-30 ENCOUNTER — Other Ambulatory Visit: Payer: Self-pay | Admitting: Surgery

## 2021-04-30 DIAGNOSIS — K8010 Calculus of gallbladder with chronic cholecystitis without obstruction: Secondary | ICD-10-CM | POA: Diagnosis not present

## 2021-04-30 DIAGNOSIS — K8020 Calculus of gallbladder without cholecystitis without obstruction: Secondary | ICD-10-CM | POA: Diagnosis not present

## 2021-06-25 DIAGNOSIS — Z23 Encounter for immunization: Secondary | ICD-10-CM | POA: Diagnosis not present

## 2021-06-25 DIAGNOSIS — E785 Hyperlipidemia, unspecified: Secondary | ICD-10-CM | POA: Diagnosis not present

## 2021-10-27 DIAGNOSIS — H524 Presbyopia: Secondary | ICD-10-CM | POA: Diagnosis not present

## 2021-10-27 DIAGNOSIS — H04123 Dry eye syndrome of bilateral lacrimal glands: Secondary | ICD-10-CM | POA: Diagnosis not present

## 2021-10-27 DIAGNOSIS — H353132 Nonexudative age-related macular degeneration, bilateral, intermediate dry stage: Secondary | ICD-10-CM | POA: Diagnosis not present

## 2021-10-27 DIAGNOSIS — H02403 Unspecified ptosis of bilateral eyelids: Secondary | ICD-10-CM | POA: Diagnosis not present

## 2021-10-27 DIAGNOSIS — H25813 Combined forms of age-related cataract, bilateral: Secondary | ICD-10-CM | POA: Diagnosis not present

## 2021-12-29 DIAGNOSIS — Z1389 Encounter for screening for other disorder: Secondary | ICD-10-CM | POA: Diagnosis not present

## 2021-12-29 DIAGNOSIS — Z8709 Personal history of other diseases of the respiratory system: Secondary | ICD-10-CM | POA: Diagnosis not present

## 2021-12-29 DIAGNOSIS — E785 Hyperlipidemia, unspecified: Secondary | ICD-10-CM | POA: Diagnosis not present

## 2021-12-29 DIAGNOSIS — N183 Chronic kidney disease, stage 3 unspecified: Secondary | ICD-10-CM | POA: Diagnosis not present

## 2021-12-29 DIAGNOSIS — Z0000 Encounter for general adult medical examination without abnormal findings: Secondary | ICD-10-CM | POA: Diagnosis not present

## 2021-12-29 DIAGNOSIS — K589 Irritable bowel syndrome without diarrhea: Secondary | ICD-10-CM | POA: Diagnosis not present

## 2021-12-29 DIAGNOSIS — Z86010 Personal history of colonic polyps: Secondary | ICD-10-CM | POA: Diagnosis not present

## 2022-02-13 DIAGNOSIS — L821 Other seborrheic keratosis: Secondary | ICD-10-CM | POA: Diagnosis not present

## 2022-03-16 DIAGNOSIS — H532 Diplopia: Secondary | ICD-10-CM | POA: Diagnosis not present

## 2022-03-16 DIAGNOSIS — H02403 Unspecified ptosis of bilateral eyelids: Secondary | ICD-10-CM | POA: Diagnosis not present

## 2022-03-16 DIAGNOSIS — H40033 Anatomical narrow angle, bilateral: Secondary | ICD-10-CM | POA: Diagnosis not present

## 2022-03-16 DIAGNOSIS — H04123 Dry eye syndrome of bilateral lacrimal glands: Secondary | ICD-10-CM | POA: Diagnosis not present

## 2022-04-24 DIAGNOSIS — L821 Other seborrheic keratosis: Secondary | ICD-10-CM | POA: Diagnosis not present

## 2022-06-30 DIAGNOSIS — K219 Gastro-esophageal reflux disease without esophagitis: Secondary | ICD-10-CM | POA: Diagnosis not present

## 2022-06-30 DIAGNOSIS — N1831 Chronic kidney disease, stage 3a: Secondary | ICD-10-CM | POA: Diagnosis not present

## 2022-06-30 DIAGNOSIS — Z23 Encounter for immunization: Secondary | ICD-10-CM | POA: Diagnosis not present

## 2022-07-06 DIAGNOSIS — J069 Acute upper respiratory infection, unspecified: Secondary | ICD-10-CM | POA: Diagnosis not present

## 2022-07-06 DIAGNOSIS — N289 Disorder of kidney and ureter, unspecified: Secondary | ICD-10-CM | POA: Diagnosis not present

## 2022-07-06 DIAGNOSIS — R197 Diarrhea, unspecified: Secondary | ICD-10-CM | POA: Diagnosis not present

## 2022-07-06 DIAGNOSIS — R42 Dizziness and giddiness: Secondary | ICD-10-CM | POA: Diagnosis not present

## 2022-07-06 DIAGNOSIS — U071 COVID-19: Secondary | ICD-10-CM | POA: Diagnosis not present

## 2022-07-06 DIAGNOSIS — I959 Hypotension, unspecified: Secondary | ICD-10-CM | POA: Diagnosis not present

## 2022-07-16 DIAGNOSIS — E875 Hyperkalemia: Secondary | ICD-10-CM | POA: Diagnosis not present

## 2022-07-16 DIAGNOSIS — Z8616 Personal history of COVID-19: Secondary | ICD-10-CM | POA: Diagnosis not present

## 2022-07-16 DIAGNOSIS — I959 Hypotension, unspecified: Secondary | ICD-10-CM | POA: Diagnosis not present

## 2022-07-16 DIAGNOSIS — N1831 Chronic kidney disease, stage 3a: Secondary | ICD-10-CM | POA: Diagnosis not present

## 2022-08-14 DIAGNOSIS — E871 Hypo-osmolality and hyponatremia: Secondary | ICD-10-CM | POA: Diagnosis not present

## 2022-08-14 DIAGNOSIS — R197 Diarrhea, unspecified: Secondary | ICD-10-CM | POA: Diagnosis not present

## 2022-11-04 ENCOUNTER — Encounter (INDEPENDENT_AMBULATORY_CARE_PROVIDER_SITE_OTHER): Payer: Medicare Other | Admitting: Ophthalmology

## 2022-11-04 DIAGNOSIS — H43813 Vitreous degeneration, bilateral: Secondary | ICD-10-CM | POA: Diagnosis not present

## 2022-11-04 DIAGNOSIS — H353231 Exudative age-related macular degeneration, bilateral, with active choroidal neovascularization: Secondary | ICD-10-CM | POA: Diagnosis not present

## 2022-12-02 ENCOUNTER — Encounter (INDEPENDENT_AMBULATORY_CARE_PROVIDER_SITE_OTHER): Payer: Medicare Other | Admitting: Ophthalmology

## 2022-12-02 DIAGNOSIS — H353231 Exudative age-related macular degeneration, bilateral, with active choroidal neovascularization: Secondary | ICD-10-CM

## 2022-12-02 DIAGNOSIS — H43813 Vitreous degeneration, bilateral: Secondary | ICD-10-CM | POA: Diagnosis not present

## 2022-12-30 ENCOUNTER — Encounter (INDEPENDENT_AMBULATORY_CARE_PROVIDER_SITE_OTHER): Payer: Medicare Other | Admitting: Ophthalmology

## 2022-12-31 ENCOUNTER — Encounter (INDEPENDENT_AMBULATORY_CARE_PROVIDER_SITE_OTHER): Payer: Medicare Other | Admitting: Ophthalmology

## 2022-12-31 DIAGNOSIS — H43813 Vitreous degeneration, bilateral: Secondary | ICD-10-CM

## 2022-12-31 DIAGNOSIS — H353231 Exudative age-related macular degeneration, bilateral, with active choroidal neovascularization: Secondary | ICD-10-CM | POA: Diagnosis not present

## 2023-01-28 ENCOUNTER — Encounter (INDEPENDENT_AMBULATORY_CARE_PROVIDER_SITE_OTHER): Payer: Medicare Other | Admitting: Ophthalmology

## 2023-01-28 DIAGNOSIS — H43813 Vitreous degeneration, bilateral: Secondary | ICD-10-CM | POA: Diagnosis not present

## 2023-01-28 DIAGNOSIS — H353231 Exudative age-related macular degeneration, bilateral, with active choroidal neovascularization: Secondary | ICD-10-CM

## 2023-02-25 ENCOUNTER — Encounter (INDEPENDENT_AMBULATORY_CARE_PROVIDER_SITE_OTHER): Payer: Medicare Other | Admitting: Ophthalmology

## 2023-02-25 DIAGNOSIS — H353231 Exudative age-related macular degeneration, bilateral, with active choroidal neovascularization: Secondary | ICD-10-CM

## 2023-02-25 DIAGNOSIS — H43813 Vitreous degeneration, bilateral: Secondary | ICD-10-CM | POA: Diagnosis not present

## 2023-03-26 ENCOUNTER — Encounter (INDEPENDENT_AMBULATORY_CARE_PROVIDER_SITE_OTHER): Payer: Medicare Other | Admitting: Ophthalmology

## 2023-03-26 DIAGNOSIS — H353231 Exudative age-related macular degeneration, bilateral, with active choroidal neovascularization: Secondary | ICD-10-CM | POA: Diagnosis not present

## 2023-03-26 DIAGNOSIS — H43813 Vitreous degeneration, bilateral: Secondary | ICD-10-CM | POA: Diagnosis not present

## 2023-04-22 ENCOUNTER — Encounter (INDEPENDENT_AMBULATORY_CARE_PROVIDER_SITE_OTHER): Payer: Medicare Other | Admitting: Ophthalmology

## 2023-04-22 DIAGNOSIS — H353231 Exudative age-related macular degeneration, bilateral, with active choroidal neovascularization: Secondary | ICD-10-CM

## 2023-04-22 DIAGNOSIS — H43813 Vitreous degeneration, bilateral: Secondary | ICD-10-CM

## 2023-05-20 ENCOUNTER — Encounter (INDEPENDENT_AMBULATORY_CARE_PROVIDER_SITE_OTHER): Payer: Medicare Other | Admitting: Ophthalmology

## 2023-05-20 DIAGNOSIS — H353231 Exudative age-related macular degeneration, bilateral, with active choroidal neovascularization: Secondary | ICD-10-CM

## 2023-05-20 DIAGNOSIS — H43813 Vitreous degeneration, bilateral: Secondary | ICD-10-CM

## 2023-06-17 ENCOUNTER — Encounter (INDEPENDENT_AMBULATORY_CARE_PROVIDER_SITE_OTHER): Payer: Medicare Other | Admitting: Ophthalmology

## 2023-06-17 DIAGNOSIS — H43813 Vitreous degeneration, bilateral: Secondary | ICD-10-CM | POA: Diagnosis not present

## 2023-06-17 DIAGNOSIS — H2513 Age-related nuclear cataract, bilateral: Secondary | ICD-10-CM | POA: Diagnosis not present

## 2023-06-17 DIAGNOSIS — H353231 Exudative age-related macular degeneration, bilateral, with active choroidal neovascularization: Secondary | ICD-10-CM

## 2023-07-22 ENCOUNTER — Encounter (INDEPENDENT_AMBULATORY_CARE_PROVIDER_SITE_OTHER): Payer: Medicare Other | Admitting: Ophthalmology

## 2023-07-22 DIAGNOSIS — H43813 Vitreous degeneration, bilateral: Secondary | ICD-10-CM | POA: Diagnosis not present

## 2023-07-22 DIAGNOSIS — H353231 Exudative age-related macular degeneration, bilateral, with active choroidal neovascularization: Secondary | ICD-10-CM | POA: Diagnosis not present

## 2023-08-24 ENCOUNTER — Encounter (INDEPENDENT_AMBULATORY_CARE_PROVIDER_SITE_OTHER): Payer: Medicare Other | Admitting: Ophthalmology

## 2023-08-24 DIAGNOSIS — H353231 Exudative age-related macular degeneration, bilateral, with active choroidal neovascularization: Secondary | ICD-10-CM | POA: Diagnosis not present

## 2023-08-24 DIAGNOSIS — H43813 Vitreous degeneration, bilateral: Secondary | ICD-10-CM | POA: Diagnosis not present

## 2023-08-24 DIAGNOSIS — H2513 Age-related nuclear cataract, bilateral: Secondary | ICD-10-CM | POA: Diagnosis not present

## 2023-08-26 ENCOUNTER — Encounter (INDEPENDENT_AMBULATORY_CARE_PROVIDER_SITE_OTHER): Payer: Medicare Other | Admitting: Ophthalmology

## 2023-09-30 ENCOUNTER — Encounter (INDEPENDENT_AMBULATORY_CARE_PROVIDER_SITE_OTHER): Payer: Medicare Other | Admitting: Ophthalmology

## 2023-09-30 DIAGNOSIS — H43813 Vitreous degeneration, bilateral: Secondary | ICD-10-CM | POA: Diagnosis not present

## 2023-09-30 DIAGNOSIS — H353231 Exudative age-related macular degeneration, bilateral, with active choroidal neovascularization: Secondary | ICD-10-CM | POA: Diagnosis not present

## 2023-11-02 ENCOUNTER — Encounter (INDEPENDENT_AMBULATORY_CARE_PROVIDER_SITE_OTHER): Payer: Medicare Other | Admitting: Ophthalmology

## 2023-11-02 DIAGNOSIS — H353231 Exudative age-related macular degeneration, bilateral, with active choroidal neovascularization: Secondary | ICD-10-CM

## 2023-11-02 DIAGNOSIS — H35033 Hypertensive retinopathy, bilateral: Secondary | ICD-10-CM | POA: Diagnosis not present

## 2023-11-02 DIAGNOSIS — I10 Essential (primary) hypertension: Secondary | ICD-10-CM | POA: Diagnosis not present

## 2023-11-02 DIAGNOSIS — H2513 Age-related nuclear cataract, bilateral: Secondary | ICD-10-CM

## 2023-11-02 DIAGNOSIS — H43813 Vitreous degeneration, bilateral: Secondary | ICD-10-CM | POA: Diagnosis not present

## 2023-12-09 ENCOUNTER — Encounter (INDEPENDENT_AMBULATORY_CARE_PROVIDER_SITE_OTHER): Payer: Medicare Other | Admitting: Ophthalmology

## 2023-12-09 DIAGNOSIS — H353231 Exudative age-related macular degeneration, bilateral, with active choroidal neovascularization: Secondary | ICD-10-CM | POA: Diagnosis not present

## 2023-12-09 DIAGNOSIS — H43813 Vitreous degeneration, bilateral: Secondary | ICD-10-CM | POA: Diagnosis not present

## 2024-01-13 ENCOUNTER — Encounter (INDEPENDENT_AMBULATORY_CARE_PROVIDER_SITE_OTHER): Admitting: Ophthalmology

## 2024-01-13 DIAGNOSIS — H2513 Age-related nuclear cataract, bilateral: Secondary | ICD-10-CM | POA: Diagnosis not present

## 2024-01-13 DIAGNOSIS — H353231 Exudative age-related macular degeneration, bilateral, with active choroidal neovascularization: Secondary | ICD-10-CM | POA: Diagnosis not present

## 2024-01-13 DIAGNOSIS — H43813 Vitreous degeneration, bilateral: Secondary | ICD-10-CM

## 2024-02-17 ENCOUNTER — Encounter (INDEPENDENT_AMBULATORY_CARE_PROVIDER_SITE_OTHER): Admitting: Ophthalmology

## 2024-02-17 DIAGNOSIS — H43813 Vitreous degeneration, bilateral: Secondary | ICD-10-CM | POA: Diagnosis not present

## 2024-02-17 DIAGNOSIS — H2513 Age-related nuclear cataract, bilateral: Secondary | ICD-10-CM | POA: Diagnosis not present

## 2024-02-17 DIAGNOSIS — H353231 Exudative age-related macular degeneration, bilateral, with active choroidal neovascularization: Secondary | ICD-10-CM | POA: Diagnosis not present

## 2024-03-23 ENCOUNTER — Encounter (INDEPENDENT_AMBULATORY_CARE_PROVIDER_SITE_OTHER): Admitting: Ophthalmology

## 2024-03-23 DIAGNOSIS — H353231 Exudative age-related macular degeneration, bilateral, with active choroidal neovascularization: Secondary | ICD-10-CM

## 2024-03-23 DIAGNOSIS — H2513 Age-related nuclear cataract, bilateral: Secondary | ICD-10-CM | POA: Diagnosis not present

## 2024-03-23 DIAGNOSIS — H43813 Vitreous degeneration, bilateral: Secondary | ICD-10-CM | POA: Diagnosis not present

## 2024-04-27 ENCOUNTER — Encounter (INDEPENDENT_AMBULATORY_CARE_PROVIDER_SITE_OTHER): Admitting: Ophthalmology

## 2024-04-27 DIAGNOSIS — H43813 Vitreous degeneration, bilateral: Secondary | ICD-10-CM | POA: Diagnosis not present

## 2024-04-27 DIAGNOSIS — H353231 Exudative age-related macular degeneration, bilateral, with active choroidal neovascularization: Secondary | ICD-10-CM

## 2024-04-27 DIAGNOSIS — H2513 Age-related nuclear cataract, bilateral: Secondary | ICD-10-CM

## 2024-06-01 ENCOUNTER — Encounter (INDEPENDENT_AMBULATORY_CARE_PROVIDER_SITE_OTHER): Admitting: Ophthalmology

## 2024-06-01 DIAGNOSIS — I10 Essential (primary) hypertension: Secondary | ICD-10-CM

## 2024-06-01 DIAGNOSIS — H35033 Hypertensive retinopathy, bilateral: Secondary | ICD-10-CM | POA: Diagnosis not present

## 2024-06-01 DIAGNOSIS — H2513 Age-related nuclear cataract, bilateral: Secondary | ICD-10-CM

## 2024-06-01 DIAGNOSIS — H43813 Vitreous degeneration, bilateral: Secondary | ICD-10-CM

## 2024-06-01 DIAGNOSIS — H353231 Exudative age-related macular degeneration, bilateral, with active choroidal neovascularization: Secondary | ICD-10-CM | POA: Diagnosis not present

## 2024-07-13 ENCOUNTER — Encounter (INDEPENDENT_AMBULATORY_CARE_PROVIDER_SITE_OTHER): Admitting: Ophthalmology

## 2024-07-13 DIAGNOSIS — H2513 Age-related nuclear cataract, bilateral: Secondary | ICD-10-CM

## 2024-07-13 DIAGNOSIS — H353231 Exudative age-related macular degeneration, bilateral, with active choroidal neovascularization: Secondary | ICD-10-CM | POA: Diagnosis not present

## 2024-07-13 DIAGNOSIS — H43813 Vitreous degeneration, bilateral: Secondary | ICD-10-CM | POA: Diagnosis not present

## 2024-08-24 ENCOUNTER — Encounter (INDEPENDENT_AMBULATORY_CARE_PROVIDER_SITE_OTHER): Admitting: Ophthalmology

## 2024-08-24 DIAGNOSIS — H2513 Age-related nuclear cataract, bilateral: Secondary | ICD-10-CM

## 2024-08-24 DIAGNOSIS — H353231 Exudative age-related macular degeneration, bilateral, with active choroidal neovascularization: Secondary | ICD-10-CM

## 2024-08-24 DIAGNOSIS — H43813 Vitreous degeneration, bilateral: Secondary | ICD-10-CM | POA: Diagnosis not present

## 2024-10-06 ENCOUNTER — Encounter (INDEPENDENT_AMBULATORY_CARE_PROVIDER_SITE_OTHER): Admitting: Ophthalmology

## 2024-10-06 DIAGNOSIS — H353231 Exudative age-related macular degeneration, bilateral, with active choroidal neovascularization: Secondary | ICD-10-CM | POA: Diagnosis not present

## 2024-10-06 DIAGNOSIS — H43813 Vitreous degeneration, bilateral: Secondary | ICD-10-CM

## 2024-10-09 ENCOUNTER — Encounter (INDEPENDENT_AMBULATORY_CARE_PROVIDER_SITE_OTHER): Admitting: Ophthalmology

## 2024-11-20 ENCOUNTER — Encounter (INDEPENDENT_AMBULATORY_CARE_PROVIDER_SITE_OTHER): Admitting: Ophthalmology
